# Patient Record
Sex: Male | Born: 1995 | Hispanic: No | Marital: Single | Smoking: Never smoker
Health system: Southern US, Community
[De-identification: ages and names within clinical notes are randomized; demographics above are authoritative.]

## PROBLEM LIST (undated history)

## (undated) DIAGNOSIS — S82892A Other fracture of left lower leg, initial encounter for closed fracture: Secondary | ICD-10-CM

## (undated) HISTORY — DX: Other fracture of left lower leg, initial encounter for closed fracture: S82.892A

---

## 2016-01-14 ENCOUNTER — Encounter (HOSPITAL_COMMUNITY): Payer: Self-pay | Admitting: Emergency Medicine

## 2016-01-14 ENCOUNTER — Emergency Department (INDEPENDENT_AMBULATORY_CARE_PROVIDER_SITE_OTHER): Payer: Medicaid Other

## 2016-01-14 ENCOUNTER — Emergency Department (INDEPENDENT_AMBULATORY_CARE_PROVIDER_SITE_OTHER)
Admission: EM | Admit: 2016-01-14 | Discharge: 2016-01-14 | Disposition: A | Discharge status: Home / Self Care | Payer: Medicaid Other | Source: Home / Self Care | Attending: Emergency Medicine | Admitting: Emergency Medicine

## 2016-01-14 DIAGNOSIS — M25572 Pain in left ankle and joints of left foot: Secondary | ICD-10-CM

## 2016-01-14 DIAGNOSIS — G8929 Other chronic pain: Secondary | ICD-10-CM | POA: Diagnosis not present

## 2016-01-14 MED ORDER — IBUPROFEN 800 MG PO TABS: 800.0000 mg | ORAL_TABLET | Freq: Four times a day (QID) | ORAL | Status: DC | PRN

## 2016-01-14 MED ORDER — TRAMADOL HCL 50 MG PO TABS: ORAL_TABLET | ORAL | Status: AC
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
## 2016-01-14 NOTE — Discharge Instructions (Signed)
Take the ibuprofen 3 times a day on a regular basis for the next 2 weeks. Wear the ASO to help support your ankle. Ice and elevate your ankle at the end of the day. Follow-up with orthopedics in a week to 10 days for reevaluation. Go to the ER for color changes, pain not controlled medications or other concerns

## 2016-02-06 DIAGNOSIS — M2550 Pain in unspecified joint: Principal | ICD-10-CM

## 2016-02-06 DIAGNOSIS — R51 Headache: Secondary | ICD-10-CM

## 2016-02-06 DIAGNOSIS — R519 Headache, unspecified: Secondary | ICD-10-CM

## 2016-02-06 DIAGNOSIS — G8929 Other chronic pain: Secondary | ICD-10-CM
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
## 2016-02-22 ENCOUNTER — Ambulatory Visit: Payer: Medicaid Other | Admitting: Internal Medicine

## 2016-02-25 ENCOUNTER — Emergency Department (HOSPITAL_COMMUNITY): Payer: Medicaid Other

## 2016-02-25 ENCOUNTER — Encounter (HOSPITAL_COMMUNITY): Payer: Self-pay | Admitting: Emergency Medicine

## 2016-02-25 DIAGNOSIS — G8929 Other chronic pain: Secondary | ICD-10-CM | POA: Insufficient documentation

## 2016-02-25 DIAGNOSIS — R1030 Lower abdominal pain, unspecified: Secondary | ICD-10-CM | POA: Diagnosis not present

## 2016-02-25 DIAGNOSIS — Z79899 Other long term (current) drug therapy: Secondary | ICD-10-CM | POA: Insufficient documentation

## 2016-02-25 DIAGNOSIS — J069 Acute upper respiratory infection, unspecified: Secondary | ICD-10-CM | POA: Diagnosis not present

## 2016-02-25 DIAGNOSIS — M79605 Pain in left leg: Secondary | ICD-10-CM | POA: Diagnosis not present

## 2016-02-25 DIAGNOSIS — R05 Cough: Secondary | ICD-10-CM | POA: Diagnosis present

## 2016-02-25 DIAGNOSIS — M549 Dorsalgia, unspecified: Secondary | ICD-10-CM | POA: Insufficient documentation

## 2016-02-25 LAB — CBC WITH DIFFERENTIAL/PLATELET
BASOS PCT: 1 %
Basophils Absolute: 0 10*3/uL (ref 0.0–0.1)
EOS ABS: 0.2 10*3/uL (ref 0.0–0.7)
Eosinophils Relative: 4 %
HCT: 48.5 % (ref 39.0–52.0)
HEMOGLOBIN: 16.4 g/dL (ref 13.0–17.0)
Lymphocytes Relative: 39 %
Lymphs Abs: 1.6 10*3/uL (ref 0.7–4.0)
MCH: 27 pg (ref 26.0–34.0)
MCHC: 33.8 g/dL (ref 30.0–36.0)
MCV: 79.8 fL (ref 78.0–100.0)
Monocytes Absolute: 0.4 10*3/uL (ref 0.1–1.0)
Monocytes Relative: 9 %
NEUTROS PCT: 47 %
Neutro Abs: 2 10*3/uL (ref 1.7–7.7)
Platelets: 177 10*3/uL (ref 150–400)
RBC: 6.08 MIL/uL — AB (ref 4.22–5.81)
RDW: 13.6 % (ref 11.5–15.5)
WBC: 4.2 10*3/uL (ref 4.0–10.5)

## 2016-02-25 LAB — I-STAT CG4 LACTIC ACID, ED: Lactic Acid, Venous: 0.96 mmol/L (ref 0.5–2.0)

## 2016-02-25 LAB — URINALYSIS, ROUTINE W REFLEX MICROSCOPIC
BILIRUBIN URINE: NEGATIVE
Glucose, UA: NEGATIVE mg/dL
Hgb urine dipstick: NEGATIVE
Ketones, ur: NEGATIVE mg/dL
Leukocytes, UA: NEGATIVE
NITRITE: NEGATIVE
Protein, ur: NEGATIVE mg/dL
SPECIFIC GRAVITY, URINE: 1.022 (ref 1.005–1.030)
pH: 6 (ref 5.0–8.0)

## 2016-02-25 MED ORDER — ACETAMINOPHEN 325 MG PO TABS
650.0000 mg | ORAL_TABLET | Freq: Once | ORAL | Status: AC | PRN
  Administered 2016-02-25: 650 mg via ORAL
  Filled 2016-02-25: qty 2
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
## 2016-02-26 ENCOUNTER — Emergency Department (HOSPITAL_COMMUNITY)
Admission: EM | Admit: 2016-02-26 | Discharge: 2016-02-26 | Disposition: A | Discharge status: Home / Self Care | Payer: Medicaid Other | Attending: Emergency Medicine | Admitting: Emergency Medicine

## 2016-02-26 DIAGNOSIS — J069 Acute upper respiratory infection, unspecified: Secondary | ICD-10-CM

## 2016-02-26 DIAGNOSIS — M791 Myalgia, unspecified site: Secondary | ICD-10-CM

## 2016-02-26 LAB — COMPREHENSIVE METABOLIC PANEL
ALBUMIN: 4.1 g/dL (ref 3.5–5.0)
ALK PHOS: 99 U/L (ref 38–126)
ALT: 18 U/L (ref 17–63)
AST: 22 U/L (ref 15–41)
Anion gap: 10 (ref 5–15)
BUN: 9 mg/dL (ref 6–20)
CALCIUM: 9.3 mg/dL (ref 8.9–10.3)
CHLORIDE: 104 mmol/L (ref 101–111)
CO2: 25 mmol/L (ref 22–32)
CREATININE: 0.87 mg/dL (ref 0.61–1.24)
GFR calc Af Amer: >60 mL/min (ref >60)
GFR calc non Af Amer: >60 mL/min (ref >60)
GLUCOSE: 87 mg/dL (ref 65–99)
Potassium: 3.8 mmol/L (ref 3.5–5.1)
SODIUM: 139 mmol/L (ref 135–145)
Total Bilirubin: 0.5 mg/dL (ref 0.3–1.2)
Total Protein: 8 g/dL (ref 6.5–8.1)

## 2016-02-26 LAB — RAPID STREP SCREEN (MED CTR MEBANE ONLY): Streptococcus, Group A Screen (Direct): NEGATIVE

## 2016-02-26 MED ORDER — KETOROLAC TROMETHAMINE 60 MG/2ML IM SOLN
30.0000 mg | Freq: Once | INTRAMUSCULAR | Status: AC
  Administered 2016-02-26: 30 mg via INTRAMUSCULAR
  Filled 2016-02-26: qty 2

## 2016-02-26 MED ORDER — IBUPROFEN 800 MG PO TABS: 800.0000 mg | ORAL_TABLET | Freq: Three times a day (TID) | ORAL | Status: AC | PRN
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
## 2016-02-26 NOTE — Discharge Instructions (Signed)
Upper Respiratory Infection, Adult Most upper respiratory infections (URIs) are a viral infection of the air passages leading to the lungs. A URI affects the nose, throat, and upper air passages. The most common type of URI is nasopharyngitis and is typically referred to as "the common cold." URIs run their course and usually go away on their own. Most of the time, a URI does not require medical attention, but sometimes a bacterial infection in the upper airways can follow a viral infection. This is called a secondary infection. Sinus and middle ear infections are common types of secondary upper respiratory infections. Bacterial pneumonia can also complicate a URI. A URI can worsen asthma and chronic obstructive pulmonary disease (COPD). Sometimes, these complications can require emergency medical care and may be life threatening.  CAUSES Almost all URIs are caused by viruses. A virus is a type of germ and can spread from one person to another.  RISKS FACTORS You may be at risk for a URI if:   You smoke.   You have chronic heart or lung disease.  You have a weakened defense (immune) system.   You are very young or very old.   You have nasal allergies or asthma.  You work in crowded or poorly ventilated areas.  You work in health care facilities or schools. SIGNS AND SYMPTOMS  Symptoms typically develop 2-3 days after you come in contact with a cold virus. Most viral URIs last 7-10 days. However, viral URIs from the influenza virus (flu virus) can last 14-18 days and are typically more severe. Symptoms may include:   Runny or stuffy (congested) nose.   Sneezing.   Cough.   Sore throat.   Headache.   Fatigue.   Fever.   Loss of appetite.   Pain in your forehead, behind your eyes, and over your cheekbones (sinus pain).  Muscle aches.  DIAGNOSIS  Your health care provider may diagnose a URI by:  Physical exam.  Tests to check that your symptoms are not due to  another condition such as:  Strep throat.  Sinusitis.  Pneumonia.  Asthma. TREATMENT  A URI goes away on its own with time. It cannot be cured with medicines, but medicines may be prescribed or recommended to relieve symptoms. Medicines may help:  Reduce your fever.  Reduce your cough.  Relieve nasal congestion. HOME CARE INSTRUCTIONS   Take medicines only as directed by your health care provider.   Gargle warm saltwater or take cough drops to comfort your throat as directed by your health care provider.  Use a warm mist humidifier or inhale steam from a shower to increase air moisture. This may make it easier to breathe.  Drink enough fluid to keep your urine clear or pale yellow.   Eat soups and other clear broths and maintain good nutrition.   Rest as needed.   Return to work when your temperature has returned to normal or as your health care provider advises. You may need to stay home longer to avoid infecting others. You can also use a face mask and careful hand washing to prevent spread of the virus.  Increase the usage of your inhaler if you have asthma.   Do not use any tobacco products, including cigarettes, chewing tobacco, or electronic cigarettes. If you need help quitting, ask your health care provider. PREVENTION  The best way to protect yourself from getting a cold is to practice good hygiene.   Avoid oral or hand contact with people with cold   symptoms.   Wash your hands often if contact occurs.  There is no clear evidence that vitamin C, vitamin E, echinacea, or exercise reduces the chance of developing a cold. However, it is always recommended to get plenty of rest, exercise, and practice good nutrition.  SEEK MEDICAL CARE IF:   You are getting worse rather than better.   Your symptoms are not controlled by medicine.   You have chills.  You have worsening shortness of breath.  You have brown or red mucus.  You have yellow or brown nasal  discharge.  You have pain in your face, especially when you bend forward.  You have a fever.  You have swollen neck glands.  You have pain while swallowing.  You have white areas in the back of your throat. SEEK IMMEDIATE MEDICAL CARE IF:   You have severe or persistent:  Headache.  Ear pain.  Sinus pain.  Chest pain.  You have chronic lung disease and any of the following:  Wheezing.  Prolonged cough.  Coughing up blood.  A change in your usual mucus.  You have a stiff neck.  You have changes in your:  Vision.  Hearing.  Thinking.  Mood. MAKE SURE YOU:   Understand these instructions.  Will watch your condition.  Will get help right away if you are not doing well or get worse.   This information is not intended to replace advice given to you by your health care provider. Make sure you discuss any questions you have with your health care provider.   Document Released: 06/03/2001 Document Revised: 04/24/2015 Document Reviewed: 03/15/2014 Elsevier Interactive Patient Education 2016 Elsevier Inc.  

## 2016-02-27 LAB — URINE CULTURE

## 2016-02-28 LAB — CULTURE, GROUP A STREP (THRC)

## 2016-03-10 DIAGNOSIS — G8929 Other chronic pain: Secondary | ICD-10-CM

## 2016-03-10 DIAGNOSIS — M2550 Pain in unspecified joint: Principal | ICD-10-CM

## 2016-03-12 DIAGNOSIS — M2550 Pain in unspecified joint: Principal | ICD-10-CM

## 2016-03-12 DIAGNOSIS — G8929 Other chronic pain: Secondary | ICD-10-CM

## 2016-03-12 NOTE — Congregational Nurse Program (Signed)
Congregational Nurse Program Note  Date of Encounter: 02/06/2016  Past Medical History: No past medical history on file.  Encounter Details:

## 2016-03-12 NOTE — Congregational Nurse Program (Unsigned)
Congregational Nurse Program Note  Date of Encounter: 03/12/2016  Past Medical History: No past medical history on file.  Encounter Details:  Office visit for follow-up missed appointment for pain in joints, arms, legs and ankles. [facility].  Recently expressed much concern about being awake all night and almost never sleeping. Original impression was a very agitated and frustrated young man recently [place] in [place] with very little social contact other than school. Continues to talk about separation from [place] in [place] and lack of sleep. Much calmer and relaxed expressing concerns today.   Plan: Reschedule appointment [facility]. On March 19, 2016 at 3:15 pm. Return to [facility]  for dental referral and follow-up. Refer to Child psychotherapistsocial worker or temporary agancy for employment information.  Ferol LuzMarietta Lanya Bucks, RN/CN [phone]

## 2016-03-12 NOTE — Congregational Nurse Program (Unsigned)
Congregational Nurse Program Note  Date of Encounter: 03/10/2016  Past Medical History: No past medical history on file.  Encounter Details: Office visit at [employer] to request assistance with rescheduling appointment with provider. [facility]. Continues to experience pain in joints. Treated at [facility], [address], [place] [phone] on 03-03-16 for pain in joints. Prescription for 800mg  q 8 hrs for pain.Feels some relief, but pain continues.  Concerned about inability to sleep at night continuously. Cannot relate any specific amount of time sleeping. Does not own TV and no light in [place].  Became very tearful and related missing mother after leaving [place] [facility]. Speaks fluent [place]; no [place]. No family present, except an uncle who is also facing isolation since arrival and unable to provide support. Attends English classes at [facility] daily with plan to seek employment.  Plan: Return 03-12-[facility]. Review use of Ibuprofen for pain. Warm bath or shower for joint pain. Refer to [employer]eth Ashworth, RN/MHN at [facility] for relaxation methods feelings of anxiety. Explore agency activities for socialization among other [place] speaking Refuges. Refer for employment options. Ferol LuzMarietta Suhayla Chisom, RN/CHN

## 2016-03-19 ENCOUNTER — Ambulatory Visit (INDEPENDENT_AMBULATORY_CARE_PROVIDER_SITE_OTHER): Payer: Medicaid Other | Admitting: Internal Medicine

## 2016-03-19 VITALS — BP 140/83 | HR 65 | Temp 98.2°F | Resp 20 | Ht 67.0 in | Wt 171.3 lb

## 2016-03-19 DIAGNOSIS — M25572 Pain in left ankle and joints of left foot: Secondary | ICD-10-CM | POA: Diagnosis not present

## 2016-03-19 DIAGNOSIS — M549 Dorsalgia, unspecified: Secondary | ICD-10-CM

## 2016-03-19 DIAGNOSIS — M25521 Pain in right elbow: Secondary | ICD-10-CM

## 2016-03-19 DIAGNOSIS — M25522 Pain in left elbow: Secondary | ICD-10-CM

## 2016-03-19 DIAGNOSIS — M25552 Pain in left hip: Secondary | ICD-10-CM | POA: Diagnosis not present

## 2016-03-19 DIAGNOSIS — S82892K Other fracture of left lower leg, subsequent encounter for closed fracture with nonunion: Secondary | ICD-10-CM | POA: Diagnosis not present

## 2016-03-19 DIAGNOSIS — S82892A Other fracture of left lower leg, initial encounter for closed fracture: Secondary | ICD-10-CM

## 2016-03-19 DIAGNOSIS — M25562 Pain in left knee: Secondary | ICD-10-CM | POA: Diagnosis not present

## 2016-03-19 MED ORDER — MELOXICAM 15 MG PO TABS: 15.0000 mg | ORAL_TABLET | Freq: Every day | ORAL | Status: AC
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
## 2016-03-19 NOTE — Patient Instructions (Signed)
1. Take Meloxicam 15 mg 1 tablet twice daily. 2. Return to clinic in 1 month.

## 2016-03-20 ENCOUNTER — Encounter: Payer: Self-pay | Admitting: Internal Medicine

## 2016-03-20 LAB — SEDIMENTATION RATE: SED RATE: 21 mm/h — AB (ref 0–15)

## 2016-03-20 NOTE — Assessment & Plan Note (Addendum)
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  Patient will need orthopedics evaluation for possible surgery and fixation. Will attempt to control patient's pain until ortho evaluation.  Will also check ESR, as patient complaining of other joint pains, but low suspicion for spondyloarthritis or AI disease given known defect likely causing axial instability. - Ortho referral - Mobic 15 mg BID - ESR

## 2016-03-26 NOTE — Progress Notes (Signed)
Internal Medicine Clinic Attending  Case discussed with Dr. Taylor at the time of the visit.  We reviewed the resident's history and exam and pertinent patient test results.  I agree with the assessment, diagnosis, and plan of care documented in the resident's note. 

## 2016-03-28 ENCOUNTER — Telehealth: Payer: Self-pay | Admitting: *Deleted

## 2016-03-28 NOTE — Telephone Encounter (Signed)
CALLED LEFT MESSAGE FOR CONTACT / CALLED LEFT MESSAGE WITH [facility] TO CALL CLINIC Monday / NEED TO SPEAK WITH SOMEONE WHO SPEAKS ENGLISH. REFERRAL CAN BE MADE, BUT WAITING TO SPEAK WITH SOMEONE WHO CAN TALK WITH PATIENT.

## 2016-04-07 ENCOUNTER — Telehealth: Payer: Self-pay | Admitting: *Deleted

## 2016-04-07 NOTE — Telephone Encounter (Signed)
SPOKE WITH BROTHER (HARVE [phone]). WE WAS UNABLE TO GET IN CONTACT WITH MR Gene TO MAKE HIS REFERRAL TO [facility] OFFICE UNTIL WE WERE ABLE TO HAVE SOMEONE WHO SPEAKS FRENCH. THE [facility] ON LONGER PROVIDE TRANSLATER FOR THIS PATIENT. WILL GET BACK IN CONTACT WITH THE BROTHER TO MAKE THIS APPOINTMENT.

## 2016-04-08 ENCOUNTER — Other Ambulatory Visit: Payer: Self-pay

## 2016-04-17 ENCOUNTER — Telehealth: Payer: Self-pay | Admitting: Internal Medicine

## 2016-04-17 NOTE — Telephone Encounter (Signed)
APPT. REMINDER CALL, NO ANSWER, NO VOICE MAIL °

## 2016-04-18 ENCOUNTER — Ambulatory Visit: Payer: Medicaid Other | Admitting: Internal Medicine

## 2016-04-18 ENCOUNTER — Encounter: Payer: Self-pay | Admitting: Internal Medicine

## 2016-06-02 ENCOUNTER — Ambulatory Visit
Admission: RE | Admit: 2016-06-02 | Discharge: 2016-06-02 | Disposition: A | Discharge status: Home / Self Care | Payer: No Typology Code available for payment source | Source: Ambulatory Visit | Attending: Infectious Disease | Admitting: Infectious Disease

## 2016-06-02 ENCOUNTER — Other Ambulatory Visit: Payer: Self-pay | Admitting: Infectious Disease

## 2016-06-02 DIAGNOSIS — R7611 Nonspecific reaction to tuberculin skin test without active tuberculosis: Secondary | ICD-10-CM

## 2016-08-19 ENCOUNTER — Emergency Department (HOSPITAL_COMMUNITY): Payer: No Typology Code available for payment source

## 2016-08-19 ENCOUNTER — Emergency Department (HOSPITAL_COMMUNITY)
Admission: EM | Admit: 2016-08-19 | Discharge: 2016-08-19 | Payer: No Typology Code available for payment source | Attending: Emergency Medicine | Admitting: Emergency Medicine

## 2016-08-19 ENCOUNTER — Encounter (HOSPITAL_COMMUNITY): Payer: Self-pay | Admitting: Emergency Medicine

## 2016-08-19 DIAGNOSIS — Y92410 Unspecified street and highway as the place of occurrence of the external cause: Secondary | ICD-10-CM | POA: Diagnosis not present

## 2016-08-19 DIAGNOSIS — Y939 Activity, unspecified: Secondary | ICD-10-CM | POA: Insufficient documentation

## 2016-08-19 DIAGNOSIS — Z23 Encounter for immunization: Secondary | ICD-10-CM | POA: Diagnosis not present

## 2016-08-19 DIAGNOSIS — S0592XA Unspecified injury of left eye and orbit, initial encounter: Secondary | ICD-10-CM | POA: Diagnosis present

## 2016-08-19 DIAGNOSIS — S01112A Laceration without foreign body of left eyelid and periocular area, initial encounter: Secondary | ICD-10-CM | POA: Diagnosis not present

## 2016-08-19 DIAGNOSIS — Z79899 Other long term (current) drug therapy: Secondary | ICD-10-CM | POA: Diagnosis not present

## 2016-08-19 DIAGNOSIS — Y999 Unspecified external cause status: Secondary | ICD-10-CM | POA: Insufficient documentation

## 2016-08-19 DIAGNOSIS — Z791 Long term (current) use of non-steroidal anti-inflammatories (NSAID): Secondary | ICD-10-CM | POA: Diagnosis not present

## 2016-08-19 MED ORDER — TETANUS-DIPHTH-ACELL PERTUSSIS 5-2.5-18.5 LF-MCG/0.5 IM SUSP
0.5000 mL | Freq: Once | INTRAMUSCULAR | Status: AC
  Administered 2016-08-19: 0.5 mL via INTRAMUSCULAR
  Filled 2016-08-19: qty 0.5

## 2016-08-19 NOTE — ED Notes (Signed)
Report given to Caitlin,RN.

## 2016-08-19 NOTE — ED Notes (Signed)
Pain assessed prior to discharge; discharged at 11:20
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
## 2016-08-19 NOTE — ED Notes (Signed)
[facility] called for transfer 

## 2016-08-19 NOTE — ED Notes (Signed)
Report given to [facility], truck will be here momentarily.

## 2016-08-19 NOTE — ED Notes (Signed)
Bed: WA25 Expected date:  Expected time:  Means of arrival:  Comments: [facility] 
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
## 2021-04-15 ENCOUNTER — Ambulatory Visit: Payer: BC Managed Care – PPO | Attending: Obstetrics and Gynecology

## 2021-04-15 ENCOUNTER — Other Ambulatory Visit: Payer: Self-pay

## 2021-05-03 ENCOUNTER — Telehealth: Payer: Self-pay | Admitting: Genetic Counselor

## 2021-05-03 NOTE — Telephone Encounter (Signed)
I called [name] and his partner Kyle Shea to discuss his genetic testing results. The couple had genetic counseling on 04/04/21 due to their history of recurrent pregnancy loss (see Genetic Counseling note in partner's chart for more details). The couple elected to each proceed with karyotype analyses for balanced chromosomal rearrangements at that time. I offered a Kyle Shea interpreter when discussing [name] results; however, the couple declined this, and [name] elected to serve as the interpreter for her partner.   I informed the couple that [name] karyotype was normal (46,XY), revealing no rearrangements of chromosomal material that can be related to recurrent pregnancy loss. Since both of the couple's karyotypes were normal, there is likely no chromosomal etiology that explains their history of recurrent miscarriages.  I placed a referral for [name] with [facility] to discuss further testing and reproductive planning options in more detail. [name] also informed me that her OBGYN provider recommended she start prophylactic aspirin. She had questions about which form/dosage of aspirin to take, so I encouraged her to contact her provider via MyChart to ask. The couple each confirmed that they had no further questions for me at this time.  Kyle Crane, MS, [facility] Genetic Counselor

## 2021-05-14 ENCOUNTER — Other Ambulatory Visit: Payer: Self-pay
